# Patient Record
Sex: Male | Born: 1960 | Race: White | Hispanic: No | Marital: Married | Smoking: Never smoker
Health system: Southern US, Community
[De-identification: ages and names within clinical notes are randomized; demographics above are authoritative.]

## PROBLEM LIST (undated history)

## (undated) DIAGNOSIS — N200 Calculus of kidney: Secondary | ICD-10-CM

## (undated) DIAGNOSIS — K219 Gastro-esophageal reflux disease without esophagitis: Secondary | ICD-10-CM

## (undated) DIAGNOSIS — E7800 Pure hypercholesterolemia, unspecified: Secondary | ICD-10-CM

## (undated) HISTORY — PX: CERVICAL DISCECTOMY: SHX98

---

## 2006-12-04 ENCOUNTER — Ambulatory Visit (HOSPITAL_COMMUNITY): Admission: RE | Admit: 2006-12-04 | Discharge: 2006-12-04 | Payer: Self-pay | Admitting: Neurological Surgery

## 2006-12-30 ENCOUNTER — Encounter: Admission: RE | Admit: 2006-12-30 | Discharge: 2006-12-30 | Payer: Self-pay | Admitting: Neurological Surgery

## 2007-02-24 ENCOUNTER — Encounter: Admission: RE | Admit: 2007-02-24 | Discharge: 2007-02-24 | Payer: Self-pay | Admitting: Neurological Surgery

## 2007-05-26 ENCOUNTER — Encounter: Admission: RE | Admit: 2007-05-26 | Discharge: 2007-05-26 | Payer: Self-pay | Admitting: Neurological Surgery

## 2007-10-29 IMAGING — CR DG CERVICAL SPINE 1V
1 series · 1 of 1 positions shown · non-contrast
Comparison: Intraoperative spot images 12/04/06.

CLINICAL DATA: Post cervical ACDF.
 CERVICAL SPINE - 1 VIEW:

[view not recorded]
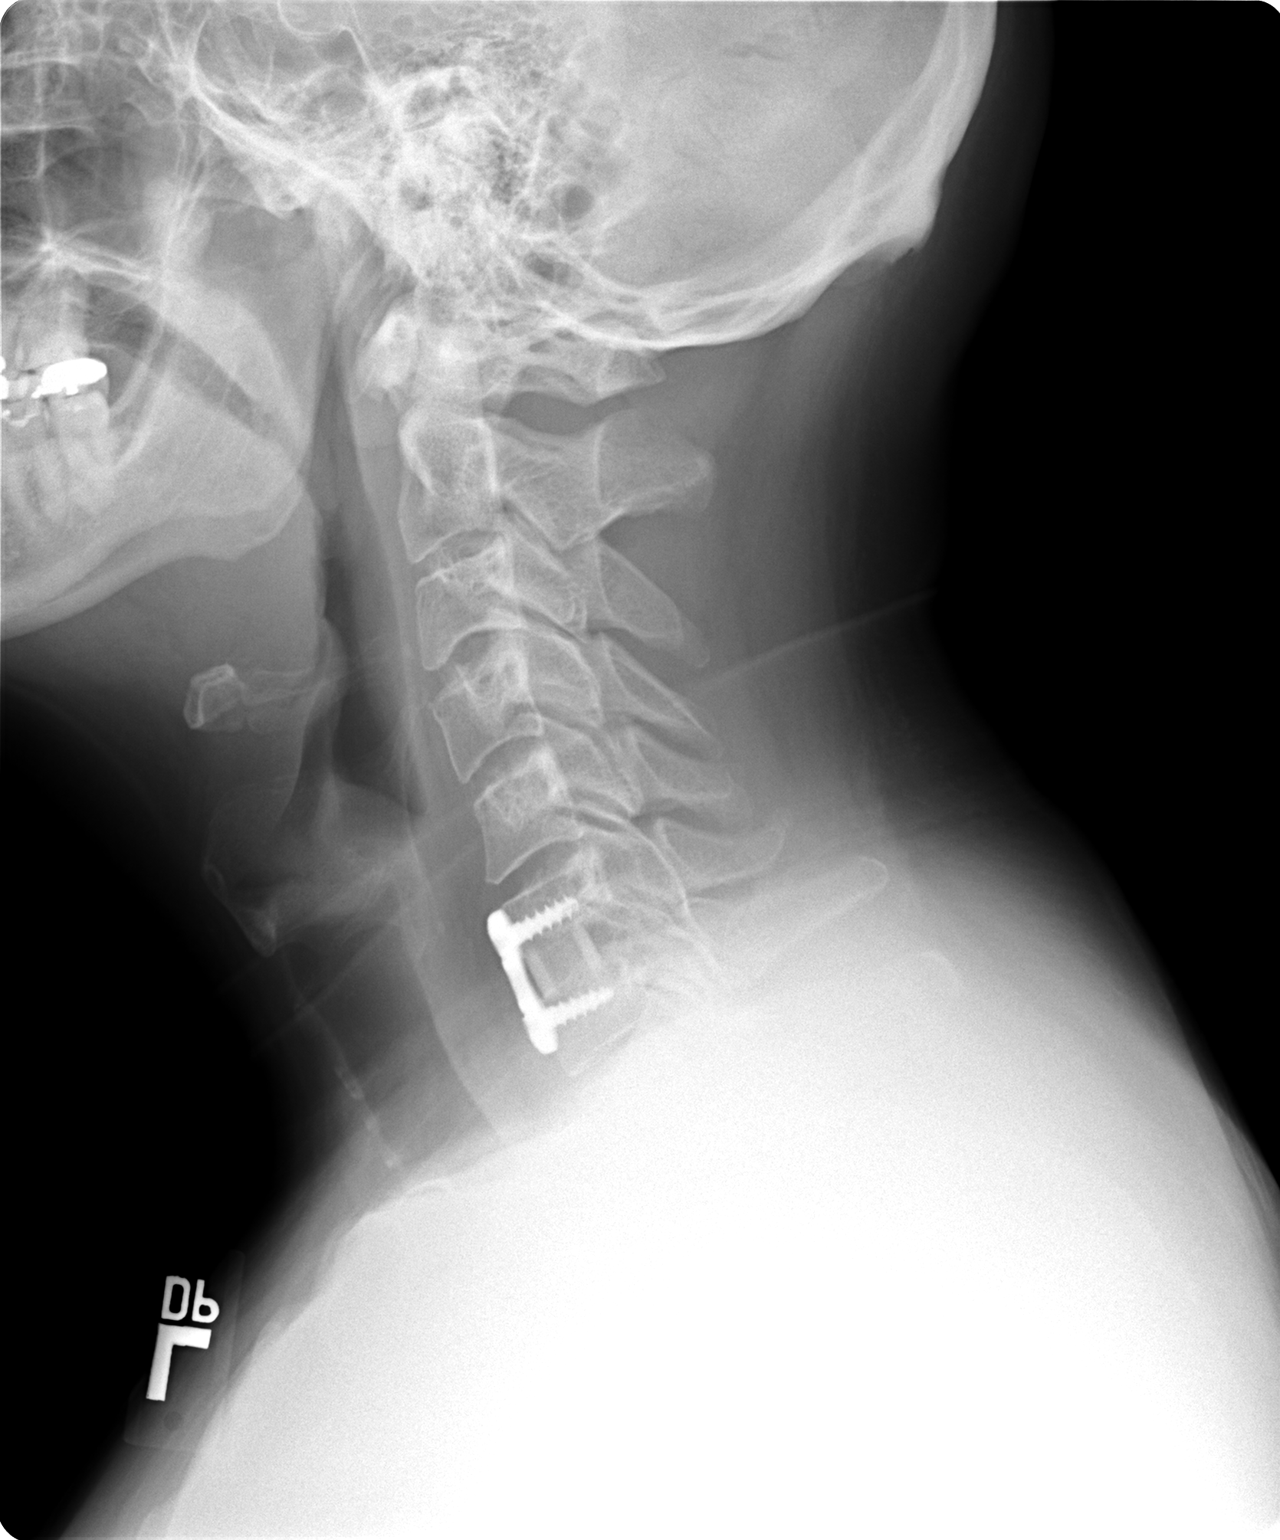

[1 of 1 positions shown; findings below may reference images not displayed]

FINDINGS: Status post C6 - C7 ACDF with anterior plate.  Bone graft well-positioned.  Other disk height normal.
IMPRESSION: Good position and alignment following C6 - C7 ACDF with anterior plate.

## 2010-10-24 NOTE — Op Note (Signed)
NAME:  ASHAWN, RINEHART               ACCOUNT NO.:  [id number]   MEDICAL RECORD NO.:  [id number]          PATIENT TYPE:  AMB   LOCATION:  SDS                          FACILITY:  [facility]   PHYSICIAN:  Tia Alert, MD     DATE OF BIRTH:  1960-08-03   DATE OF PROCEDURE:  12/03/2006  DATE OF DISCHARGE:                               OPERATIVE REPORT   PREOPERATIVE DIAGNOSIS:  Cervical spondylosis with cervical disk  herniation, C6-7, with left arm pain.   POSTOPERATIVE DIAGNOSIS:  Cervical spondylosis with cervical disk  herniation, C6-7, with left arm pain.   PROCEDURES:  1. Decompressive anterior cervical diskectomy, C6-7.  2. Anterior cervical arthrodesis, C6-7, utilizing an 8 mm      corticocancellous allograft.  3. Anterior cervical plating, C6-7, utilizing 25-mm Venture plate.   SURGEON:  Tia Alert, MD   ASSISTANT:  Donalee Citrin, MD   ANESTHESIA:  General endotracheal.   COMPLICATIONS:  None apparent.   INDICATIONS FOR PROCEDURE:  Mr. Welcher is a 50 year old gentleman who was  referred with neck and left arm pain consistent with a C7 radiculopathy.  He had an MRI, which showed significant spondylosis with canal stenosis  and neural foraminal narrowing at C6-7 from a spondylitic ridge and a  cervical disk herniation to the left.  He had tried medical management  for quite some time without significant relief.  I recommended an  anterior cervical diskectomy with fusion and plating at C6-7.  He  understood the risks, benefits and expected outcome and wished to  proceed.   DESCRIPTION OF PROCEDURE:  The patient was taken to operating room and  after induction of adequate generalized endotracheal anesthesia, he was  placed in the supine position on the operating room table.  His right  anterior cervical region was prepped with DuraPrep and then draped in  the usual sterile fashion.  Local anesthesia 5 mL was injected and a  transverse incision was made to the right of midline  and carried down to  the platysma, which was elevated, opened and undermined with Metzenbaum  scissors.  I then dissected a plane medial to the sternocleidomastoid  muscle and internal carotid artery and lateral to the trachea and  esophagus to expose C6-7.  Intraoperative x-ray confirmed my level at C6-  7 and then the annulus was incised and initial the diskectomy was done  with pituitary rongeurs and curved Karlin curettes.  We then used the  high-speed drill to drill the endplates to prepare for later  arthrodesis.  We drilled the endplates down to the level of the  posterior longitudinal ligament and the posterior spurs.  The operating  microscope was then brought into the field and the 1 and 2-mm Kerrison  punches were used to perform a circumferential decompression of the  central canal by undercutting the bodies of C6 and C7.  The posterior  longitudinal ligament was opened and removed circumferentially while  undercutting the bodies of C6 and C7.  We marched along the superior  endplate of C7 until the pedicles bilaterally were identified.  We then  marched along the pedicles to decompress the C7 nerve roots bilaterally.  We spent considerable time on the left side because of his left-sided  symptoms.  I followed the nerve root out along the pedicle until I was a  distal to the pedicle and could palpate the entire medial, superior and  lateral border of the pedicle with a Black nerve hook and follow the  nerve out distal to the pedicle itself to assure adequate decompression.  Two small fragments were removed from the shoulder of the nerve root  also.  Once our decompression was complete, we could see the cord  pulsatile through the dura.  The dura was full and capacious all the way  across, suggesting a good decompression of the central canal.  We then  irrigated with saline solution containing bacitracin, measured our  interspace to be 8 mm, then used an 8-mm corticocancellous  allograft and  tapped this into position at C6-7.  We then used a 25-mm Venture plate  and placed two 13-mm variable-angled screws into the bodies of C6 and  C7, and these locked into the plate by locking mechanism within the  plate.  We then irrigated with saline solution with bacitracin, dried  all bleeding points with bipolar cautery and once meticulous hemostasis  was achieved, closed the platysma with 3-0 Vicryl, closed the  subcuticular tissue with 0 Vicryl, and closed the skin with Benzoin and  Steri-Strips.  The drapes removed.  A sterile dressing was applied.  The  patient was awakened from general anesthesia and transported to the  recovery room in stable condition.  At the end of procedure all sponge,  needle and instrument counts were correct.      Tia Alert, MD  Electronically Signed     DSJ/MEDQ  D:  12/04/2006  T:  12/04/2006  Job:  [id number]

## 2011-03-28 LAB — COMPREHENSIVE METABOLIC PANEL
Alkaline Phosphatase: 77
BUN: 13
CO2: 31
Calcium: 9.1
Chloride: 106
Creatinine, Ser: 0.93
GFR calc Af Amer: >60
GFR calc non Af Amer: >60
Glucose, Bld: 73

## 2011-03-28 LAB — CBC
HCT: 45.7
MCHC: 33.7
Platelets: 171
RBC: 4.85
RDW: 13.2
WBC: 5.3

## 2011-03-28 LAB — DIFFERENTIAL
Eosinophils Absolute: 0.1
Lymphocytes Relative: 30
Neutro Abs: 3.2

## 2011-03-28 LAB — PROTIME-INR: Prothrombin Time: 12.4

## 2011-03-28 LAB — APTT: aPTT: 26

## 2014-02-05 ENCOUNTER — Encounter (HOSPITAL_COMMUNITY): Payer: Self-pay | Admitting: Emergency Medicine

## 2014-02-05 ENCOUNTER — Emergency Department (HOSPITAL_COMMUNITY)
Admission: EM | Admit: 2014-02-05 | Discharge: 2014-02-06 | Disposition: A | Discharge status: Home / Self Care | Payer: PRIVATE HEALTH INSURANCE | Attending: Emergency Medicine | Admitting: Emergency Medicine

## 2014-02-05 DIAGNOSIS — Z8719 Personal history of other diseases of the digestive system: Secondary | ICD-10-CM | POA: Insufficient documentation

## 2014-02-05 DIAGNOSIS — N200 Calculus of kidney: Secondary | ICD-10-CM | POA: Insufficient documentation

## 2014-02-05 DIAGNOSIS — R112 Nausea with vomiting, unspecified: Secondary | ICD-10-CM | POA: Diagnosis not present

## 2014-02-05 DIAGNOSIS — Z862 Personal history of diseases of the blood and blood-forming organs and certain disorders involving the immune mechanism: Secondary | ICD-10-CM | POA: Insufficient documentation

## 2014-02-05 DIAGNOSIS — N201 Calculus of ureter: Secondary | ICD-10-CM | POA: Insufficient documentation

## 2014-02-05 DIAGNOSIS — Z8639 Personal history of other endocrine, nutritional and metabolic disease: Secondary | ICD-10-CM | POA: Insufficient documentation

## 2014-02-05 HISTORY — DX: Calculus of kidney: N20.0

## 2014-02-05 HISTORY — DX: Pure hypercholesterolemia, unspecified: E78.00

## 2014-02-05 HISTORY — DX: Gastro-esophageal reflux disease without esophagitis: K21.9

## 2014-02-05 LAB — COMPREHENSIVE METABOLIC PANEL
ALK PHOS: 58 U/L (ref 39–117)
ALT: 30 U/L (ref 0–53)
AST: 22 U/L (ref 0–37)
Albumin: 3.9 g/dL (ref 3.5–5.2)
Anion gap: 13 (ref 5–15)
BUN: 14 mg/dL (ref 6–23)
CO2: 24 meq/L (ref 19–32)
Calcium: 8.7 mg/dL (ref 8.4–10.5)
Chloride: 106 mEq/L (ref 96–112)
Creatinine, Ser: 0.96 mg/dL (ref 0.50–1.35)
GFR calc Af Amer: >90 mL/min (ref >90)
GFR calc non Af Amer: >90 mL/min (ref >90)
GLUCOSE: 114 mg/dL — AB (ref 70–99)
Potassium: 4.2 mEq/L (ref 3.7–5.3)
SODIUM: 143 meq/L (ref 137–147)
Total Bilirubin: 0.2 mg/dL — ABNORMAL LOW (ref 0.3–1.2)
Total Protein: 6.3 g/dL (ref 6.0–8.3)

## 2014-02-05 LAB — CBC WITH DIFFERENTIAL/PLATELET
BASOS ABS: 0 10*3/uL (ref 0.0–0.1)
Basophils Relative: 0 % (ref 0–1)
EOS ABS: 0 10*3/uL (ref 0.0–0.7)
Eosinophils Relative: 1 % (ref 0–5)
HEMATOCRIT: 39.9 % (ref 39.0–52.0)
HEMOGLOBIN: 13.3 g/dL (ref 13.0–17.0)
LYMPHS ABS: 1.1 10*3/uL (ref 0.7–4.0)
LYMPHS PCT: 13 % (ref 12–46)
MCH: 30.2 pg (ref 26.0–34.0)
MCHC: 33.3 g/dL (ref 30.0–36.0)
MCV: 90.5 fL (ref 78.0–100.0)
MONO ABS: 0.5 10*3/uL (ref 0.1–1.0)
MONOS PCT: 6 % (ref 3–12)
NEUTROS ABS: 6.6 10*3/uL (ref 1.7–7.7)
NEUTROS PCT: 80 % — AB (ref 43–77)
Platelets: 160 10*3/uL (ref 150–400)
RBC: 4.41 MIL/uL (ref 4.22–5.81)
RDW: 12.6 % (ref 11.5–15.5)
WBC: 8.2 10*3/uL (ref 4.0–10.5)

## 2014-02-05 LAB — URINALYSIS, ROUTINE W REFLEX MICROSCOPIC
Bilirubin Urine: NEGATIVE
GLUCOSE, UA: NEGATIVE mg/dL
Ketones, ur: NEGATIVE mg/dL
Leukocytes, UA: NEGATIVE
NITRITE: NEGATIVE
Protein, ur: NEGATIVE mg/dL
SPECIFIC GRAVITY, URINE: 1.021 (ref 1.005–1.030)
Urobilinogen, UA: 0.2 mg/dL (ref 0.0–1.0)
pH: 6 (ref 5.0–8.0)

## 2014-02-05 LAB — URINE MICROSCOPIC-ADD ON

## 2014-02-05 MED ORDER — ONDANSETRON HCL 4 MG/2ML IJ SOLN
4.0000 mg | Freq: Once | INTRAMUSCULAR | Status: AC
  Administered 2014-02-05: 4 mg via INTRAVENOUS
  Filled 2014-02-05: qty 2

## 2014-02-05 MED ORDER — FENTANYL CITRATE 0.05 MG/ML IJ SOLN
INTRAMUSCULAR | Status: AC
  Filled 2014-02-05: qty 2

## 2014-02-05 MED ORDER — FENTANYL CITRATE 0.05 MG/ML IJ SOLN
50.0000 ug | Freq: Once | INTRAMUSCULAR | Status: AC
  Administered 2014-02-05: 50 ug via INTRAVENOUS
  Filled 2014-02-05: qty 2

## 2014-02-05 MED ORDER — OXYCODONE-ACETAMINOPHEN 5-325 MG PO TABS: 1.0000 | ORAL_TABLET | Freq: Four times a day (QID) | ORAL | Status: DC | PRN

## 2014-02-05 MED ORDER — FENTANYL CITRATE 0.05 MG/ML IJ SOLN
50.0000 ug | Freq: Once | INTRAMUSCULAR | Status: AC
  Administered 2014-02-05: 50 ug via NASAL

## 2014-02-05 MED ORDER — ONDANSETRON 8 MG PO TBDP: 8.0000 mg | ORAL_TABLET | Freq: Three times a day (TID) | ORAL | Status: DC | PRN

## 2014-02-05 NOTE — ED Provider Notes (Signed)
CSN: [id number]     Arrival date & time 02/05/14  1941 History   First MD Initiated Contact with Patient 02/05/14 2328     Chief Complaint  Patient presents with  . Nephrolithiasis     (Consider location/radiation/quality/duration/timing/severity/associated sxs/prior Treatment) The history is provided by the patient.   and patient presents with right lower back/flank pain. Began earlier this evening around 7:00. States it feels like her previous kidney stone in the head. Pain has come and gone somewhat. His been relieved by pain medicine in ER. He had vomiting with the episode. No diarrhea constipation. No dysuria. Last kidney stone was in 1997. He states his mother has had aching kidney stones. No abdominal pain. No fevers. Pain is described as crampy. He states he was trying to walk around to make the pain better.  Past Medical History  Diagnosis Date  . Nephrolithiasis   . GERD (gastroesophageal reflux disease)   . Hypercholesterolemia    Past Surgical History  Procedure Laterality Date  . Cervical discectomy     No family history on file. History  Substance Use Topics  . Smoking status: Never Smoker   . Smokeless tobacco: Not on file  . Alcohol Use: Yes    Review of Systems  Constitutional: Negative for activity change and appetite change.  Eyes: Negative for pain.  Respiratory: Negative for chest tightness and shortness of breath.   Cardiovascular: Negative for chest pain and leg swelling.  Gastrointestinal: Positive for nausea and vomiting. Negative for abdominal pain and diarrhea.  Genitourinary: Positive for flank pain.  Musculoskeletal: Positive for back pain. Negative for neck stiffness.  Skin: Negative for rash.  Neurological: Negative for weakness, numbness and headaches.  Psychiatric/Behavioral: Negative for behavioral problems.      Allergies  Review of patient's allergies indicates no known allergies.  Home Medications   Prior to Admission medications    Medication Sig Start Date End Date Taking? Authorizing Provider  ondansetron (ZOFRAN-ODT) 8 MG disintegrating tablet Take 1 tablet (8 mg total) by mouth every 8 (eight) hours as needed for nausea or vomiting. 02/05/14   Juliet Rude. Harish Bram, MD  oxyCODONE-acetaminophen (PERCOCET/ROXICET) 5-325 MG per tablet Take 1-2 tablets by mouth every 6 (six) hours as needed for severe pain. 02/05/14   Juliet Rude. Reyaan Thoma, MD   BP 114/74  Pulse 68  Temp(Src) 98.3 F (36.8 C) (Oral)  Resp 16  Ht  (1.753 m)  Wt 200 lb (90.719 kg)  BMI 29.52 kg/m2  SpO2 99% Physical Exam  Nursing note and vitals reviewed. Constitutional: He is oriented to person, place, and time. He appears well-developed and well-nourished.  HENT:  Head: Normocephalic and atraumatic.  Eyes: EOM are normal. Pupils are equal, round, and reactive to light.  Neck: Normal range of motion. Neck supple.  Cardiovascular: Normal rate, regular rhythm and normal heart sounds.   No murmur heard. Pulmonary/Chest: Effort normal and breath sounds normal.  Abdominal: Soft. Bowel sounds are normal. He exhibits no distension and no mass. There is no tenderness. There is no rebound and no guarding.  Genitourinary:  No testicular tenderness or mass  Musculoskeletal: Normal range of motion. He exhibits tenderness. He exhibits no edema.  Some tenderness over lower back and right side. No frank CVA tenderness.  Neurological: He is alert and oriented to person, place, and time. No cranial nerve deficit.  Skin: Skin is warm and dry.  Psychiatric: He has a normal mood and affect.    ED Course  Procedures (  including critical care time) Labs Review Labs Reviewed  URINALYSIS, ROUTINE W REFLEX MICROSCOPIC - Abnormal; Notable for the following:    Hgb urine dipstick LARGE (*)    All other components within normal limits  CBC WITH DIFFERENTIAL - Abnormal; Notable for the following:    Neutrophils Relative % 80 (*)    All other components within  normal limits  COMPREHENSIVE METABOLIC PANEL - Abnormal; Notable for the following:    Glucose, Bld 114 (*)    Total Bilirubin 0.2 (*)    All other components within normal limits  URINE MICROSCOPIC-ADD ON    Imaging Review No results found.   EKG Interpretation None      MDM   Final diagnoses:  Right ureteral stone    Patient with flank/back pain. Likely repeat ureteral stone. Lab work reassuring. Urinalysis shows hematuria. No infection. No testicular tenderness. Patient feels much better after treatment. At this point we have elected not to get a CT scan. Patient states that this isn't a medicine she thinks it is an outpatient. likely repeat stone and will followup with urology as needed. Patient was given followup instructions and other return precautions    Juliet Rude. Rubin Payor, MD 02/05/14 2358

## 2014-02-05 NOTE — ED Notes (Signed)
Dr Pickering at bedside 

## 2014-02-05 NOTE — ED Notes (Signed)
Unable to give urine specimen at this time .  

## 2014-02-05 NOTE — Discharge Instructions (Signed)

## 2014-02-05 NOTE — ED Notes (Addendum)
Pt. vomitted after receiving intranasal Fentanyl .

## 2014-02-05 NOTE — ED Notes (Signed)
Pt. reports right lower flank pain with emesis onset this evening , pt. stated pain felt like his kidney stone in the past , no hematuria or fever .

## 2014-02-06 ENCOUNTER — Emergency Department (HOSPITAL_COMMUNITY)
Admission: EM | Admit: 2014-02-06 | Discharge: 2014-02-07 | Disposition: A | Discharge status: Home / Self Care | Payer: PRIVATE HEALTH INSURANCE | Attending: Emergency Medicine | Admitting: Emergency Medicine

## 2014-02-06 ENCOUNTER — Encounter (HOSPITAL_COMMUNITY): Payer: Self-pay | Admitting: Emergency Medicine

## 2014-02-06 DIAGNOSIS — R109 Unspecified abdominal pain: Secondary | ICD-10-CM | POA: Insufficient documentation

## 2014-02-06 DIAGNOSIS — N201 Calculus of ureter: Secondary | ICD-10-CM | POA: Diagnosis not present

## 2014-02-06 DIAGNOSIS — E7800 Pure hypercholesterolemia, unspecified: Secondary | ICD-10-CM | POA: Diagnosis not present

## 2014-02-06 DIAGNOSIS — K219 Gastro-esophageal reflux disease without esophagitis: Secondary | ICD-10-CM | POA: Insufficient documentation

## 2014-02-06 DIAGNOSIS — R066 Hiccough: Secondary | ICD-10-CM | POA: Diagnosis not present

## 2014-02-06 DIAGNOSIS — R112 Nausea with vomiting, unspecified: Secondary | ICD-10-CM | POA: Diagnosis not present

## 2014-02-06 DIAGNOSIS — Z79899 Other long term (current) drug therapy: Secondary | ICD-10-CM | POA: Insufficient documentation

## 2014-02-06 LAB — CBC WITH DIFFERENTIAL/PLATELET
BASOS PCT: 0 % (ref 0–1)
Basophils Absolute: 0 10*3/uL (ref 0.0–0.1)
EOS PCT: 0 % (ref 0–5)
Eosinophils Absolute: 0 10*3/uL (ref 0.0–0.7)
HEMATOCRIT: 40.7 % (ref 39.0–52.0)
HEMOGLOBIN: 13.4 g/dL (ref 13.0–17.0)
LYMPHS ABS: 1 10*3/uL (ref 0.7–4.0)
LYMPHS PCT: 10 % — AB (ref 12–46)
MCH: 30.4 pg (ref 26.0–34.0)
MCHC: 32.9 g/dL (ref 30.0–36.0)
MCV: 92.3 fL (ref 78.0–100.0)
Monocytes Absolute: 0.9 10*3/uL (ref 0.1–1.0)
Monocytes Relative: 10 % (ref 3–12)
NEUTROS ABS: 8 10*3/uL — AB (ref 1.7–7.7)
NEUTROS PCT: 80 % — AB (ref 43–77)
Platelets: 147 10*3/uL — ABNORMAL LOW (ref 150–400)
RBC: 4.41 MIL/uL (ref 4.22–5.81)
RDW: 12.7 % (ref 11.5–15.5)
WBC: 9.9 10*3/uL (ref 4.0–10.5)

## 2014-02-06 LAB — URINALYSIS, ROUTINE W REFLEX MICROSCOPIC
BILIRUBIN URINE: NEGATIVE
GLUCOSE, UA: NEGATIVE mg/dL
Ketones, ur: 15 mg/dL — AB
Leukocytes, UA: NEGATIVE
NITRITE: NEGATIVE
PH: 5.5 (ref 5.0–8.0)
Protein, ur: NEGATIVE mg/dL
SPECIFIC GRAVITY, URINE: 1.02 (ref 1.005–1.030)
Urobilinogen, UA: 1 mg/dL (ref 0.0–1.0)

## 2014-02-06 LAB — URINE MICROSCOPIC-ADD ON

## 2014-02-06 LAB — BASIC METABOLIC PANEL
Anion gap: 12 (ref 5–15)
BUN: 14 mg/dL (ref 6–23)
CALCIUM: 8.9 mg/dL (ref 8.4–10.5)
CHLORIDE: 101 meq/L (ref 96–112)
CO2: 25 meq/L (ref 19–32)
Creatinine, Ser: 1.24 mg/dL (ref 0.50–1.35)
GFR calc Af Amer: 76 mL/min — ABNORMAL LOW (ref >90)
GFR, EST NON AFRICAN AMERICAN: 66 mL/min — AB (ref >90)
GLUCOSE: 116 mg/dL — AB (ref 70–99)
Potassium: 3.8 mEq/L (ref 3.7–5.3)
SODIUM: 138 meq/L (ref 137–147)

## 2014-02-06 MED ORDER — ONDANSETRON HCL 4 MG/2ML IJ SOLN
4.0000 mg | Freq: Once | INTRAMUSCULAR | Status: AC
  Administered 2014-02-06: 4 mg via INTRAVENOUS
  Filled 2014-02-06: qty 2

## 2014-02-06 MED ORDER — PROMETHAZINE HCL 25 MG/ML IJ SOLN
12.5000 mg | Freq: Once | INTRAMUSCULAR | Status: DC
  Filled 2014-02-06: qty 1

## 2014-02-06 MED ORDER — HYDROMORPHONE HCL PF 1 MG/ML IJ SOLN
1.0000 mg | Freq: Once | INTRAMUSCULAR | Status: AC
  Administered 2014-02-06: 1 mg via INTRAVENOUS
  Filled 2014-02-06: qty 1

## 2014-02-06 MED ORDER — FENTANYL CITRATE 0.05 MG/ML IJ SOLN: 50.0000 ug | Freq: Once | INTRAMUSCULAR | Status: DC

## 2014-02-06 MED ORDER — SODIUM CHLORIDE 0.9 % IV BOLUS (SEPSIS)
1000.0000 mL | Freq: Once | INTRAVENOUS | Status: AC
  Administered 2014-02-06: 1000 mL via INTRAVENOUS

## 2014-02-06 MED ORDER — METOCLOPRAMIDE HCL 5 MG/ML IJ SOLN
10.0000 mg | INTRAMUSCULAR | Status: AC
  Administered 2014-02-06: 10 mg via INTRAVENOUS
  Filled 2014-02-06: qty 2

## 2014-02-06 NOTE — ED Notes (Signed)
Pt. reports persistent right upper flank pain with emesis onset this evening unrelieved by prescription medications , seen here last night for the same complaints discharged home with prescriptions .

## 2014-02-06 NOTE — ED Provider Notes (Signed)
CSN: [id number]     Arrival date & time 02/06/14  2101 History   First MD Initiated Contact with Patient 02/06/14 2226     Chief Complaint  Patient presents with  . Flank Pain  . Emesis    (Consider location/radiation/quality/duration/timing/severity/associated sxs/prior Treatment) HPI Comments: Patient is a 53 y/o male with a hx of kidney stones and GERD who presents to the ED today for persistent flank pain. Patient states he was seen and evaluated yesterday for R flank pain and hematuria; dx with likely kidney stone. Since d/c, pain has been well controlled with percocet. Patient states that this AM he began having hiccups which have been persisting with little relief. He states his hiccups have exacerbated his acid reflux over the course of the day. Patient noticed that at 1900 this evening his flank pain returned. Flank pain is stabbing and present in his R flank; nonradiating. He took 2 percocet without relief of symptoms. Shortly after, patient experienced 2 episodes of NB/NB emesis with another 5 episodes upon arrival to the ED at 2130. Pain and hiccups have both persisted; pain now rated 6/10 from 9/10. Patient denies fever, syncope, CP, SOB, diarrhea, melena, hematochezia, hematemesis, dysuria, hematuria, numbness, and weakness.  Patient is a 53 y.o. male presenting with flank pain and vomiting. The history is provided by the patient. No language interpreter was used.  Flank Pain Associated symptoms include nausea and vomiting. Pertinent negatives include no chest pain, fever, numbness or weakness.  Emesis   Past Medical History  Diagnosis Date  . Nephrolithiasis   . GERD (gastroesophageal reflux disease)   . Hypercholesterolemia    Past Surgical History  Procedure Laterality Date  . Cervical discectomy     No family history on file. History  Substance Use Topics  . Smoking status: Never Smoker   . Smokeless tobacco: Not on file  . Alcohol Use: Yes    Review of Systems   Constitutional: Negative for fever.  Respiratory: Negative for shortness of breath.        +hiccups  Cardiovascular: Negative for chest pain.  Gastrointestinal: Positive for nausea and vomiting.  Genitourinary: Positive for flank pain. Negative for dysuria, hematuria and decreased urine volume.  Neurological: Negative for syncope, weakness and numbness.  All other systems reviewed and are negative.    Allergies  Review of patient's allergies indicates no known allergies.  Home Medications   Prior to Admission medications   Medication Sig Start Date End Date Taking? Authorizing Provider  omeprazole (PRILOSEC) 20 MG capsule Take 20 mg by mouth daily.   Yes Historical Provider, MD  ondansetron (ZOFRAN-ODT) 8 MG disintegrating tablet Take 1 tablet (8 mg total) by mouth every 8 (eight) hours as needed for nausea or vomiting. 02/05/14  Yes Juliet Rude. Pickering, MD  oxyCODONE-acetaminophen (PERCOCET/ROXICET) 5-325 MG per tablet Take 1-2 tablets by mouth every 6 (six) hours as needed for severe pain. 02/05/14  Yes Juliet Rude. Pickering, MD  rosuvastatin (CRESTOR) 10 MG tablet Take 10 mg by mouth daily.   Yes Historical Provider, MD  tamsulosin (FLOMAX) 0.4 MG CAPS capsule Take 1 capsule (0.4 mg total) by mouth daily. 02/07/14   Antony Madura, PA-C   BP 124/66  Pulse 86  Temp(Src) 98.4 F (36.9 C) (Oral)  Resp 10  Ht  (1.778 m)  Wt 185 lb (83.915 kg)  BMI 26.54 kg/m2  SpO2 83%  Physical Exam  Nursing note and vitals reviewed. Constitutional: He is oriented to person, place, and time.  He appears well-developed and well-nourished. No distress.  Nontoxic/nonseptic appearing  HENT:  Head: Normocephalic and atraumatic.  Eyes: Conjunctivae and EOM are normal. No scleral icterus.  Neck: Normal range of motion.  Cardiovascular: Normal rate, regular rhythm and normal heart sounds.   Pulmonary/Chest: Effort normal. No respiratory distress. He has no wheezes. He has no rales.  Lungs clear  bilaterally. Chest expansion symmetric.  Abdominal: Soft. He exhibits no mass. There is tenderness. There is no rebound and no guarding.  R CVA TTP. No anterior abdominal tenderness to palpation. No peritoneal signs. No masses. Abdomen soft.  Musculoskeletal: Normal range of motion.  Neurological: He is alert and oriented to person, place, and time. He exhibits normal muscle tone. Coordination normal.  GCS 15. Patient moves extremities without ataxia.  Skin: Skin is warm and dry. No rash noted. He is not diaphoretic. No erythema. No pallor.  Psychiatric: He has a normal mood and affect. His behavior is normal.    ED Course  Procedures (including critical care time) Labs Review Labs Reviewed  CBC WITH DIFFERENTIAL - Abnormal; Notable for the following:    Platelets 147 (*)    Neutrophils Relative % 80 (*)    Neutro Abs 8.0 (*)    Lymphocytes Relative 10 (*)    All other components within normal limits  BASIC METABOLIC PANEL - Abnormal; Notable for the following:    Glucose, Bld 116 (*)    GFR calc non Af Amer 66 (*)    GFR calc Af Amer 76 (*)    All other components within normal limits  URINALYSIS, ROUTINE W REFLEX MICROSCOPIC - Abnormal; Notable for the following:    Hgb urine dipstick LARGE (*)    Ketones, ur 15 (*)    All other components within normal limits  URINE MICROSCOPIC-ADD ON - Abnormal; Notable for the following:    Casts HYALINE CASTS (*)    All other components within normal limits    Imaging Review Ct Abdomen Pelvis Wo Contrast  02/07/2014   CLINICAL DATA:  Right flank pain.  Hematuria.  EXAM: CT ABDOMEN AND PELVIS WITHOUT CONTRAST  TECHNIQUE: Multidetector CT imaging of the abdomen and pelvis was performed following the standard protocol without IV contrast.  COMPARISON:  Acute abdominal series from the same day.  FINDINGS: The lung bases are clear without focal nodule, mass, or airspace disease. The heart size is normal. No significant pleural or pericardial  effusion is present.  Mild fatty infiltration of the liver is evident. No discrete focal lesion is evident. A small hiatal hernia is noted. The stomach, duodenum, and pancreas is otherwise normal. The common bile duct and gallbladder are normal.  There is stranding about the right kidney with mild to moderate right-sided hydronephrosis. The right ureter is dilated to the level of an obstructing 3 mm stone at the UVJ. For additional nonobstructing stones are present in the right kidney, the largest measuring 5 mm. At least 3 nonobstructing stones are present in the left kidney. The largest measures 4 mm. The left ureter is normal. The urinary bladder is within normal limits.  The rectosigmoid colon is within normal limits. The remainder the colon is unremarkable. The appendix is visualized and normal. The small bowel is unremarkable. No significant adenopathy or free fluid is present.  There is fat hernia need into the new the left inguinal canal without associated bowel.  Bone windows are unremarkable.  IMPRESSION: 1. Obstructing 3 mm right UVJ stone with mild to moderate right-sided  hydronephrosis. 2. Additional bilateral nonobstructing nephrolithiasis measuring up to 5 mm on the right and 4 mm on the left. 3. Left inguinal hernia contains fat without bowel.   Electronically Signed   By: Gennette Pac M.D.   On: 02/07/2014 01:20   Dg Abd Acute W/chest  02/07/2014   CLINICAL DATA:  Right flank pain.  Emesis.  Hiccups for 3 days.  EXAM: ACUTE ABDOMEN SERIES (ABDOMEN 2 VIEW & CHEST 1 VIEW)  COMPARISON:  Chest 11/29/2006  FINDINGS: Postoperative change in the cervical spine.  Normal heart size and pulmonary vascularity. No focal airspace disease or consolidation in the lungs. No blunting of costophrenic angles. No pneumothorax. Mediastinal contours appear intact.  Scattered gas and stool in the colon. No small or large bowel distention. No free intra-abdominal air. No abnormal air-fluid levels. No radiopaque  stones. Visualized bones appear intact.  Degenerative changes in the hips.  IMPRESSION: Negative abdominal radiographs.  No acute cardiopulmonary disease.   Electronically Signed   By: Burman Nieves M.D.   On: 02/07/2014 00:42     EKG Interpretation None      MDM   Final diagnoses:  Ureterolithiasis  Hiccups    53 year old male presents to the emergency department for persistent hiccups as well as worsening of his right flank pain. Patient was seen and evaluated yesterday and diagnosed with likely kidney stones. He states he has been taking his Percocet at home without improvement in symptoms and experienced approximately 7 episodes of nonbloody, nonbilious emesis prior to arrival. Patient on arrival appeared uncomfortable. He endorses nausea but denies any further emesis. Nausea well controlled in ED with Zofran. Patient also given Reglan for nausea which has resolved his hiccups. Dilaudid given for pain control; pain down to 1/10 from 7/10 on arrival. On reexam, patient cuddling with wife in exam room bed in no visible or audible discomfort.   Labs completed which is significant for mild bump in creatinine to 1.24. Persistent microscopic hematuria makes ureterolithiasis likely. As patient had no imaging completed yesterday, CT abdomen pelvis ordered which shows a 3 mm stone at the right UPJ with mild/moderate hydronephrosis. This finding is c/w patient's flank pain.  Patient stable and appropriate for discharge with instruction to follow up with urology as needed if symptoms persist. Will add Flomax and have advised ibuprofen for additional pain control as needed. Return precautions discussed and provided. Patient agreeable to plan with no unaddressed concerns.   Filed Vitals:   02/06/14 2300 02/07/14 0000 02/07/14 0045 02/07/14 0115  BP: 142/94 118/76 130/71 124/66  Pulse: 75 84 85 86  Temp:      TempSrc:      Resp: Height:      Weight:      SpO2: 75% 91% 96% 83%        Antony Madura, PA-C 02/07/14 0246

## 2014-02-07 ENCOUNTER — Emergency Department (HOSPITAL_COMMUNITY): Payer: PRIVATE HEALTH INSURANCE

## 2014-02-07 MED ORDER — ONDANSETRON HCL 4 MG/2ML IJ SOLN
4.0000 mg | Freq: Once | INTRAMUSCULAR | Status: AC
  Administered 2014-02-07: 4 mg via INTRAVENOUS
  Filled 2014-02-07: qty 2

## 2014-02-07 MED ORDER — HYDROMORPHONE HCL PF 1 MG/ML IJ SOLN
0.5000 mg | Freq: Once | INTRAMUSCULAR | Status: AC
  Administered 2014-02-07: 0.5 mg via INTRAVENOUS
  Filled 2014-02-07: qty 1

## 2014-02-07 MED ORDER — TAMSULOSIN HCL 0.4 MG PO CAPS: 0.4000 mg | ORAL_CAPSULE | Freq: Every day | ORAL | Status: DC

## 2014-02-07 MED ORDER — HYDROMORPHONE HCL PF 1 MG/ML IJ SOLN: 1.0000 mg | Freq: Once | INTRAMUSCULAR | Status: DC

## 2014-02-07 MED ORDER — KETOROLAC TROMETHAMINE 30 MG/ML IJ SOLN
30.0000 mg | Freq: Once | INTRAMUSCULAR | Status: AC
  Administered 2014-02-07: 30 mg via INTRAVENOUS
  Filled 2014-02-07: qty 1

## 2014-02-07 NOTE — ED Provider Notes (Signed)
  This was a shared visit with a mid-level provided (NP or PA).  Throughout the patient's course I was available for consultation/collaboration.  I saw the ECG (if appropriate), relevant labs and studies - I agree with the interpretation.  On my exam the patient was in no distress.  His pain had resolved. He, his wife and I discussed kidney stones at length. Patient was discharged in stable condition to follow up with urology.      Gerhard Munch, MD 02/07/14 [id number]

## 2014-02-07 NOTE — Discharge Instructions (Signed)
You have a 3mm stone that is close to passing into your bladder. Continue taking Percocet for pain control. You may also take 600 mg ibuprofen every 6 hours if additional pain control is needed. Take Zofran for nausea. You may take Flomax to promote stone movement. Followup with urology if symptoms persist.  Kidney Stones Kidney stones (urolithiasis) are deposits that form inside your kidneys. The intense pain is caused by the stone moving through the urinary tract. When the stone moves, the ureter goes into spasm around the stone. The stone is usually passed in the urine.  CAUSES   A disorder that makes certain neck glands produce too much parathyroid hormone (primary hyperparathyroidism).  A buildup of uric acid crystals, similar to gout in your joints.  Narrowing (stricture) of the ureter.  A kidney obstruction present at birth (congenital obstruction).  Previous surgery on the kidney or ureters.  Numerous kidney infections. SYMPTOMS   Feeling sick to your stomach (nauseous).  Throwing up (vomiting).  Blood in the urine (hematuria).  Pain that usually spreads (radiates) to the groin.  Frequency or urgency of urination. DIAGNOSIS   Taking a history and physical exam.  Blood or urine tests.  CT scan.  Occasionally, an examination of the inside of the urinary bladder (cystoscopy) is performed. TREATMENT   Observation.  Increasing your fluid intake.  Extracorporeal shock wave lithotripsy--This is a noninvasive procedure that uses shock waves to break up kidney stones.  Surgery may be needed if you have severe pain or persistent obstruction. There are various surgical procedures. Most of the procedures are performed with the use of small instruments. Only small incisions are needed to accommodate these instruments, so recovery time is minimized. The size, location, and chemical composition are all important variables that will determine the proper choice of action for you.  Talk to your health care provider to better understand your situation so that you will minimize the risk of injury to yourself and your kidney.  HOME CARE INSTRUCTIONS   Drink enough water and fluids to keep your urine clear or pale yellow. This will help you to pass the stone or stone fragments.  Strain all urine through the provided strainer. Keep all particulate matter and stones for your health care provider to see. The stone causing the pain may be as small as a grain of salt. It is very important to use the strainer each and every time you pass your urine. The collection of your stone will allow your health care provider to analyze it and verify that a stone has actually passed. The stone analysis will often identify what you can do to reduce the incidence of recurrences.  Only take over-the-counter or prescription medicines for pain, discomfort, or fever as directed by your health care provider.  Make a follow-up appointment with your health care provider as directed.  Get follow-up X-rays if required. The absence of pain does not always mean that the stone has passed. It may have only stopped moving. If the urine remains completely obstructed, it can cause loss of kidney function or even complete destruction of the kidney. It is your responsibility to make sure X-rays and follow-ups are completed. Ultrasounds of the kidney can show blockages and the status of the kidney. Ultrasounds are not associated with any radiation and can be performed easily in a matter of minutes. SEEK MEDICAL CARE IF:  You experience pain that is progressive and unresponsive to any pain medicine you have been prescribed. SEEK IMMEDIATE MEDICAL  CARE IF:   Pain cannot be controlled with the prescribed medicine.  You have a fever or shaking chills.  The severity or intensity of pain increases over 18 hours and is not relieved by pain medicine.  You develop a new onset of abdominal pain.  You feel faint or pass  out.  You are unable to urinate. MAKE SURE YOU:   Understand these instructions.  Will watch your condition.  Will get help right away if you are not doing well or get worse. Document Released: 05/28/2005 Document Revised: 01/28/2013 Document Reviewed: 10/29/2012 [facility] Patient Information 2015 [place], [place]. This information is not intended to replace advice given to you by your health care provider. Make sure you discuss any questions you have with your health care provider.  Hiccups A hiccup is the result of a sudden shortening of the muscle below your lungs (diaphragm). This movement of your diaphragm is then followed by the closing of your vocal cords, which causes the hiccup sound. Most people get the hiccups. Typically, hiccups last only a short amount of time. There are three types of hiccups:   Benign: last less than 48 hours.  Persistent: last more than 48 hours, but less than 1 month.  Intractable: last more than 1 month. A hiccup is a reflex. You cannot control reflexes. CAUSES  Causes of the hiccups can include:   Eating too much.  Drinking too much alcohol or fizzy drinks.  Eating too fast.  Eating or drinking hot and spicy foods or drinks.  Using certain medicines that have hiccupping as a side effect. Several medical conditions may also cause hiccups, including, but not limited to:  Stroke.  Gastroesophageal reflux.  Multiple sclerosis.  Traumatic brain injury.  Brain tumor.  Meningitis.  Having damage to the nerve that affects the diaphragm. Usually, though, hiccups have no apparent cause and are not the result of a serious medical condition. DIAGNOSIS  Tests may be performed to diagnose a possible condition associated with persistent or intractable hiccups. TREATMENT  Most cases of the hiccups need no treatment. None of the numerous home remedies have been proven to be effective. If your hiccups do require treatment, your treatment may  include:  Medicine. Medicine may be given intravenously (by IV) or by mouth.  Hypnosis or acupuncture.  Surgery to the nerve that affects the diaphragm may be tried in severe cases. If your hiccups are caused by an underlying medical condition, treatment for the medical condition may be necessary.  HOME CARE INSTRUCTIONS   Eat small meals.  Limit alcohol intake to no more than 1 drink per day for nonpregnant women and 2 drinks per day for men. One drink equals 12 ounces of beer, 5 ounces of wine, or 1 ounces of hard liquor.  Limit drinking fizzy drinks.  Eat and chew your food slowly.  Take medicines only as directed by your health care provider. SEEK MEDICAL CARE IF:   Your hiccups last for more than 48 hours.  You are given medicine, but your hiccups do not get better.  You cannot sleep or eat due to the hiccups.  You have unexpected weight loss due to the hiccups.  You have trouble breathing or swallowing.  You have a fever.  You develop severe pain in your abdomen.  You develop numbness, tingling, or weakness. Document Released: 08/06/2001 Document Revised: 10/12/2013 Document Reviewed: 07/19/2010 [facility] Patient Information 2015 [place], [place]. This information is not intended to replace advice given to you by your  health care provider. Make sure you discuss any questions you have with your health care provider. ° °

## 2014-06-19 ENCOUNTER — Emergency Department (HOSPITAL_COMMUNITY)
Admission: EM | Admit: 2014-06-19 | Discharge: 2014-06-19 | Disposition: A | Discharge status: Home / Self Care | Payer: BLUE CROSS/BLUE SHIELD | Attending: Emergency Medicine | Admitting: Emergency Medicine

## 2014-06-19 ENCOUNTER — Encounter (HOSPITAL_COMMUNITY): Payer: Self-pay | Admitting: *Deleted

## 2014-06-19 DIAGNOSIS — E785 Hyperlipidemia, unspecified: Secondary | ICD-10-CM | POA: Insufficient documentation

## 2014-06-19 DIAGNOSIS — N23 Unspecified renal colic: Secondary | ICD-10-CM | POA: Insufficient documentation

## 2014-06-19 DIAGNOSIS — Z87442 Personal history of urinary calculi: Secondary | ICD-10-CM | POA: Diagnosis not present

## 2014-06-19 DIAGNOSIS — K219 Gastro-esophageal reflux disease without esophagitis: Secondary | ICD-10-CM | POA: Insufficient documentation

## 2014-06-19 DIAGNOSIS — Z79899 Other long term (current) drug therapy: Secondary | ICD-10-CM | POA: Diagnosis not present

## 2014-06-19 DIAGNOSIS — R109 Unspecified abdominal pain: Secondary | ICD-10-CM | POA: Diagnosis present

## 2014-06-19 LAB — CBC WITH DIFFERENTIAL/PLATELET
BASOS ABS: 0 10*3/uL (ref 0.0–0.1)
BASOS PCT: 0 % (ref 0–1)
EOS ABS: 0.1 10*3/uL (ref 0.0–0.7)
Eosinophils Relative: 1 % (ref 0–5)
HCT: 42.4 % (ref 39.0–52.0)
Hemoglobin: 14 g/dL (ref 13.0–17.0)
LYMPHS ABS: 1.7 10*3/uL (ref 0.7–4.0)
LYMPHS PCT: 19 % (ref 12–46)
MCH: 29.8 pg (ref 26.0–34.0)
MCHC: 33 g/dL (ref 30.0–36.0)
MCV: 90.2 fL (ref 78.0–100.0)
MONOS PCT: 7 % (ref 3–12)
Monocytes Absolute: 0.6 10*3/uL (ref 0.1–1.0)
Neutro Abs: 6.5 10*3/uL (ref 1.7–7.7)
Neutrophils Relative %: 73 % (ref 43–77)
Platelets: 162 10*3/uL (ref 150–400)
RBC: 4.7 MIL/uL (ref 4.22–5.81)
RDW: 13.8 % (ref 11.5–15.5)
WBC: 8.9 10*3/uL (ref 4.0–10.5)

## 2014-06-19 LAB — URINALYSIS, ROUTINE W REFLEX MICROSCOPIC
Bilirubin Urine: NEGATIVE
GLUCOSE, UA: NEGATIVE mg/dL
KETONES UR: 15 mg/dL — AB
LEUKOCYTES UA: NEGATIVE
Nitrite: NEGATIVE
PROTEIN: NEGATIVE mg/dL
Specific Gravity, Urine: 1.029 (ref 1.005–1.030)
Urobilinogen, UA: 1 mg/dL (ref 0.0–1.0)
pH: 6 (ref 5.0–8.0)

## 2014-06-19 LAB — URINE MICROSCOPIC-ADD ON

## 2014-06-19 LAB — BASIC METABOLIC PANEL
Anion gap: 12 (ref 5–15)
BUN: 13 mg/dL (ref 6–23)
CHLORIDE: 108 meq/L (ref 96–112)
CO2: 22 mmol/L (ref 19–32)
CREATININE: 1.12 mg/dL (ref 0.50–1.35)
Calcium: 8.9 mg/dL (ref 8.4–10.5)
GFR, EST AFRICAN AMERICAN: 86 mL/min — AB (ref >90)
GFR, EST NON AFRICAN AMERICAN: 74 mL/min — AB (ref >90)
GLUCOSE: 105 mg/dL — AB (ref 70–99)
Potassium: 3.8 mmol/L (ref 3.5–5.1)
Sodium: 142 mmol/L (ref 135–145)

## 2014-06-19 MED ORDER — ONDANSETRON HCL 4 MG/2ML IJ SOLN
4.0000 mg | Freq: Once | INTRAMUSCULAR | Status: AC
Start: 2014-06-19 — End: 2014-06-19
  Administered 2014-06-19: 4 mg via INTRAVENOUS
  Filled 2014-06-19: qty 2

## 2014-06-19 MED ORDER — KETOROLAC TROMETHAMINE 30 MG/ML IJ SOLN
30.0000 mg | Freq: Once | INTRAMUSCULAR | Status: AC
  Administered 2014-06-19: 30 mg via INTRAVENOUS
  Filled 2014-06-19: qty 1

## 2014-06-19 MED ORDER — SODIUM CHLORIDE 0.9 % IV SOLN
Freq: Once | INTRAVENOUS | Status: AC
  Administered 2014-06-19: 17:00:00 via INTRAVENOUS

## 2014-06-19 MED ORDER — OXYCODONE-ACETAMINOPHEN 5-325 MG PO TABS
2.0000 | ORAL_TABLET | ORAL | Status: AC | PRN
Start: 2014-06-19 — End: ?

## 2014-06-19 MED ORDER — OXYCODONE-ACETAMINOPHEN 5-325 MG PO TABS
2.0000 | ORAL_TABLET | Freq: Once | ORAL | Status: AC
  Administered 2014-06-19: 2 via ORAL
  Filled 2014-06-19: qty 2

## 2014-06-19 MED ORDER — ONDANSETRON 4 MG PO TBDP: 4.0000 mg | ORAL_TABLET | Freq: Three times a day (TID) | ORAL | Status: AC | PRN

## 2014-06-19 MED ORDER — SODIUM CHLORIDE 0.9 % IV BOLUS (SEPSIS)
1000.0000 mL | Freq: Once | INTRAVENOUS | Status: AC
  Administered 2014-06-19: 1000 mL via INTRAVENOUS

## 2014-06-19 MED ORDER — TAMSULOSIN HCL 0.4 MG PO CAPS: 0.4000 mg | ORAL_CAPSULE | Freq: Every day | ORAL | Status: AC

## 2014-06-19 MED ORDER — HYDROMORPHONE HCL 1 MG/ML IJ SOLN
1.0000 mg | INTRAMUSCULAR | Status: AC | PRN
  Administered 2014-06-19 (×2): 1 mg via INTRAVENOUS
  Filled 2014-06-19 (×2): qty 1

## 2014-06-19 MED ORDER — TAMSULOSIN HCL 0.4 MG PO CAPS
0.4000 mg | ORAL_CAPSULE | Freq: Once | ORAL | Status: AC
  Administered 2014-06-19: 0.4 mg via ORAL
  Filled 2014-06-19: qty 1

## 2014-06-19 MED ORDER — PROMETHAZINE HCL 25 MG/ML IJ SOLN
12.5000 mg | Freq: Once | INTRAMUSCULAR | Status: AC
  Administered 2014-06-19: 12.5 mg via INTRAVENOUS
  Filled 2014-06-19: qty 1

## 2014-06-19 MED ORDER — ONDANSETRON HCL 4 MG/2ML IJ SOLN
4.0000 mg | Freq: Once | INTRAMUSCULAR | Status: AC
  Administered 2014-06-19: 4 mg via INTRAVENOUS
  Filled 2014-06-19: qty 2

## 2014-06-19 NOTE — ED Notes (Signed)
Lab called for update on BMP, states it is running now. Dr. Fayrene FearingJames and pt notified of delay.

## 2014-06-19 NOTE — ED Notes (Signed)
Called lab to follow up on BMP, states it is running  Now and should result momentarily.

## 2014-06-19 NOTE — Discharge Instructions (Signed)

## 2014-06-19 NOTE — ED Notes (Signed)
Pt from home for c/o right sided flank pain and lower back pain, hx of kidney stones and states that this pain feels the same. Denies any diarrhea or fevers or urinary symptoms. States nausea and emesis x2. Pt denies any hematuria or hemoptysis. NAD. axox 4.

## 2014-06-19 NOTE — ED Notes (Signed)
Pt reports onset this afternoon of left side back/flank pain. Hx of kidney stones.

## 2014-06-19 NOTE — ED Notes (Signed)
Pt knows that urine is needed. Pt has tried to void

## 2014-06-19 NOTE — ED Provider Notes (Signed)
CSN: [id number]     Arrival date & time 06/19/14  1635 History   First MD Initiated Contact with Patient 06/19/14 1646     Chief Complaint  Patient presents with  . Flank Pain      HPI  Patient presents evaluation of left flank pain. Sudden onset of severe left flank pain is sharp and radiated down to his right groin. Prior episode of kidney stone in August of last year. Passed without difficulty. Similar symptoms tonight. Nausea vomiting a sudden colicky pain. Feels like he needs to void but cannot.  Past Medical History  Diagnosis Date  . Nephrolithiasis   . GERD (gastroesophageal reflux disease)   . Hypercholesterolemia    Past Surgical History  Procedure Laterality Date  . Cervical discectomy     History reviewed. No pertinent family history. History  Substance Use Topics  . Smoking status: Never Smoker   . Smokeless tobacco: Not on file  . Alcohol Use: Yes    Review of Systems  Constitutional: Negative for fever, chills, diaphoresis, appetite change and fatigue.  HENT: Negative for mouth sores, sore throat and trouble swallowing.   Eyes: Negative for visual disturbance.  Respiratory: Negative for cough, chest tightness, shortness of breath and wheezing.   Cardiovascular: Negative for chest pain.  Gastrointestinal: Positive for nausea, vomiting and abdominal pain. Negative for diarrhea and abdominal distention.  Endocrine: Negative for polydipsia, polyphagia and polyuria.  Genitourinary: Positive for urgency, flank pain and decreased urine volume. Negative for dysuria, frequency and hematuria.  Musculoskeletal: Negative for gait problem.  Skin: Negative for color change, pallor and rash.  Neurological: Negative for dizziness, syncope, light-headedness and headaches.  Hematological: Does not bruise/bleed easily.  Psychiatric/Behavioral: Negative for behavioral problems and confusion.      Allergies  Review of patient's allergies indicates no known  allergies.  Home Medications   Prior to Admission medications   Medication Sig Start Date End Date Taking? Authorizing Provider  ibuprofen (ADVIL,MOTRIN) 200 MG tablet Take 400 mg by mouth 2 (two) times daily as needed (pain).   Yes Historical Provider, MD  omeprazole (PRILOSEC OTC) 20 MG tablet Take 20 mg by mouth daily.   Yes Historical Provider, MD  PRESCRIPTION MEDICATION Take 1 tablet by mouth daily as needed (bursitis/shoulder pain).   Yes Historical Provider, MD  simvastatin (ZOCOR) 20 MG tablet Take 20 mg by mouth at bedtime.  04/17/14  Yes Historical Provider, MD  ondansetron (ZOFRAN ODT) 4 MG disintegrating tablet Take 1 tablet (4 mg total) by mouth every 8 (eight) hours as needed for nausea. 06/19/14   Rolland Porter, MD  oxyCODONE-acetaminophen (PERCOCET/ROXICET) 5-325 MG per tablet Take 2 tablets by mouth every 4 (four) hours as needed. 06/19/14   Rolland Porter, MD  tamsulosin (FLOMAX) 0.4 MG CAPS capsule Take 1 capsule (0.4 mg total) by mouth daily. 06/19/14   Rolland Porter, MD   BP 150/76 mmHg  Pulse 64  Temp(Src) 97.3 F (36.3 C) (Oral)  Resp 12  SpO2 98% Physical Exam  Constitutional: He is oriented to person, place, and time. He appears well-developed and well-nourished. No distress.  Restless. Unable to hold still. Appears quite uncomfortable. Diaphoretic.  HENT:  Head: Normocephalic.  Eyes: Conjunctivae are normal. Pupils are equal, round, and reactive to light. No scleral icterus.  Neck: Normal range of motion. Neck supple. No thyromegaly present.  Cardiovascular: Normal rate and regular rhythm.  Exam reveals no gallop and no friction rub.   No murmur heard. Pulmonary/Chest: Effort normal and  breath sounds normal. No respiratory distress. He has no wheezes. He has no rales.  Abdominal: Soft. Bowel sounds are normal. He exhibits no distension. There is no tenderness. There is no rebound.    Musculoskeletal: Normal range of motion.  Neurological: He is alert and oriented to  person, place, and time.  Skin: Skin is warm and dry. No rash noted.  Psychiatric: He has a normal mood and affect. His behavior is normal.    ED Course  Procedures (including critical care time) Labs Review Labs Reviewed  URINALYSIS, ROUTINE W REFLEX MICROSCOPIC - Abnormal; Notable for the following:    Hgb urine dipstick LARGE (*)    Ketones, ur 15 (*)    All other components within normal limits  BASIC METABOLIC PANEL - Abnormal; Notable for the following:    Glucose, Bld 105 (*)    GFR calc non Af Amer 74 (*)    GFR calc Af Amer 86 (*)    All other components within normal limits  CBC WITH DIFFERENTIAL  URINE MICROSCOPIC-ADD ON    Imaging Review No results found.   EKG Interpretation None      MDM   Final diagnoses:  Ureteral colic    CT from August showed passing 5 mm stone. Had 4 and 5 mm stones bilaterally. He has bloody urine today. Normal renal function. I do not feel he needs additional imaging. Plan will be home, hydration, pain control, urological follow-up. He was given multiple doses IV medications here is nausea pain under good control.   Rolland PorterMark Konnie Noffsinger, MD 06/19/14 2126

## 2014-06-19 NOTE — ED Notes (Addendum)
Pt resting and oxygen saturation decreased to 88% on RA, increased to 100% on 2L nasal cannula. Axo x4.

## 2016-04-30 ENCOUNTER — Other Ambulatory Visit: Payer: Self-pay | Admitting: Orthopedic Surgery

## 2016-04-30 DIAGNOSIS — M67912 Unspecified disorder of synovium and tendon, left shoulder: Secondary | ICD-10-CM

## 2016-05-14 ENCOUNTER — Other Ambulatory Visit: Payer: PRIVATE HEALTH INSURANCE
